# Patient Record
Sex: Female | Born: 1965
Health system: Southern US, Community
[De-identification: ages and names within clinical notes are randomized; demographics above are authoritative.]

## PROBLEM LIST (undated history)

## (undated) HISTORY — PX: TONSILLECTOMY: SUR1361

---

## 1999-10-01 ENCOUNTER — Other Ambulatory Visit: Admission: RE | Admit: 1999-10-01 | Discharge: 1999-10-01 | Payer: Self-pay | Admitting: Obstetrics & Gynecology

## 2001-04-06 ENCOUNTER — Other Ambulatory Visit: Admission: RE | Admit: 2001-04-06 | Discharge: 2001-04-06 | Payer: Self-pay | Admitting: Obstetrics & Gynecology

## 2003-02-15 ENCOUNTER — Other Ambulatory Visit: Admission: RE | Admit: 2003-02-15 | Discharge: 2003-02-15 | Payer: Self-pay | Admitting: Obstetrics and Gynecology

## 2018-10-14 DIAGNOSIS — R7301 Impaired fasting glucose: Secondary | ICD-10-CM | POA: Diagnosis not present

## 2018-10-14 DIAGNOSIS — Z6828 Body mass index (BMI) 28.0-28.9, adult: Secondary | ICD-10-CM | POA: Diagnosis not present

## 2018-10-14 DIAGNOSIS — Z1331 Encounter for screening for depression: Secondary | ICD-10-CM | POA: Diagnosis not present

## 2018-10-14 DIAGNOSIS — Z0000 Encounter for general adult medical examination without abnormal findings: Secondary | ICD-10-CM | POA: Diagnosis not present

## 2018-10-14 DIAGNOSIS — E559 Vitamin D deficiency, unspecified: Secondary | ICD-10-CM | POA: Diagnosis not present

## 2018-10-22 DIAGNOSIS — J302 Other seasonal allergic rhinitis: Secondary | ICD-10-CM | POA: Diagnosis not present

## 2018-11-04 MED FILL — DULOXETINE HCL 60 MG CPEP: 60 | 30 days supply | Qty: 60 | Fill #0

## 2018-12-09 MED FILL — DULOXETINE HCL 60 MG CPEP: 60 | 30 days supply | Qty: 60 | Fill #1

## 2019-02-01 ENCOUNTER — Other Ambulatory Visit: Payer: Self-pay | Admitting: Family

## 2019-02-01 ENCOUNTER — Other Ambulatory Visit: Payer: Self-pay | Admitting: Nurse Practitioner

## 2019-02-01 DIAGNOSIS — Z1231 Encounter for screening mammogram for malignant neoplasm of breast: Secondary | ICD-10-CM

## 2019-03-10 ENCOUNTER — Ambulatory Visit
Admission: RE | Admit: 2019-03-10 | Discharge: 2019-03-10 | Disposition: A | Discharge status: Home / Self Care | Payer: 59 | Source: Ambulatory Visit | Attending: Nurse Practitioner | Admitting: Nurse Practitioner

## 2019-03-10 ENCOUNTER — Encounter: Payer: Self-pay | Admitting: Radiology

## 2019-03-10 DIAGNOSIS — Z1231 Encounter for screening mammogram for malignant neoplasm of breast: Secondary | ICD-10-CM | POA: Diagnosis not present

## 2019-04-20 DIAGNOSIS — E559 Vitamin D deficiency, unspecified: Secondary | ICD-10-CM | POA: Diagnosis not present

## 2019-04-20 DIAGNOSIS — E785 Hyperlipidemia, unspecified: Secondary | ICD-10-CM | POA: Diagnosis not present

## 2019-04-20 DIAGNOSIS — R7303 Prediabetes: Secondary | ICD-10-CM | POA: Diagnosis not present

## 2019-10-16 ENCOUNTER — Other Ambulatory Visit: Payer: Self-pay | Admitting: Family

## 2019-10-16 DIAGNOSIS — Z1331 Encounter for screening for depression: Secondary | ICD-10-CM | POA: Diagnosis not present

## 2019-10-16 DIAGNOSIS — Z0000 Encounter for general adult medical examination without abnormal findings: Secondary | ICD-10-CM | POA: Diagnosis not present

## 2019-11-01 DIAGNOSIS — Z0000 Encounter for general adult medical examination without abnormal findings: Secondary | ICD-10-CM | POA: Diagnosis not present

## 2019-11-01 DIAGNOSIS — E785 Hyperlipidemia, unspecified: Secondary | ICD-10-CM | POA: Diagnosis not present

## 2019-11-01 DIAGNOSIS — R7303 Prediabetes: Secondary | ICD-10-CM | POA: Diagnosis not present

## 2019-11-01 DIAGNOSIS — E559 Vitamin D deficiency, unspecified: Secondary | ICD-10-CM | POA: Diagnosis not present

## 2020-04-08 DIAGNOSIS — J029 Acute pharyngitis, unspecified: Secondary | ICD-10-CM | POA: Diagnosis not present

## 2020-04-08 DIAGNOSIS — Z20828 Contact with and (suspected) exposure to other viral communicable diseases: Secondary | ICD-10-CM | POA: Diagnosis not present

## 2020-05-08 ENCOUNTER — Other Ambulatory Visit: Payer: Self-pay | Admitting: Family

## 2020-05-08 DIAGNOSIS — Z1231 Encounter for screening mammogram for malignant neoplasm of breast: Secondary | ICD-10-CM

## 2020-06-05 ENCOUNTER — Other Ambulatory Visit: Payer: Self-pay

## 2020-06-05 ENCOUNTER — Ambulatory Visit
Admission: RE | Admit: 2020-06-05 | Discharge: 2020-06-05 | Disposition: A | Discharge status: Home / Self Care | Payer: 59 | Source: Ambulatory Visit | Attending: Family | Admitting: Family

## 2020-06-05 DIAGNOSIS — Z1231 Encounter for screening mammogram for malignant neoplasm of breast: Secondary | ICD-10-CM | POA: Diagnosis not present

## 2020-09-06 DIAGNOSIS — U071 COVID-19: Secondary | ICD-10-CM | POA: Diagnosis not present

## 2020-10-15 ENCOUNTER — Other Ambulatory Visit: Payer: Self-pay | Admitting: Family

## 2020-10-16 DIAGNOSIS — N951 Menopausal and female climacteric states: Secondary | ICD-10-CM | POA: Diagnosis not present

## 2020-10-16 DIAGNOSIS — Z0000 Encounter for general adult medical examination without abnormal findings: Secondary | ICD-10-CM | POA: Diagnosis not present

## 2020-10-16 DIAGNOSIS — Z6828 Body mass index (BMI) 28.0-28.9, adult: Secondary | ICD-10-CM | POA: Diagnosis not present

## 2020-10-16 DIAGNOSIS — F419 Anxiety disorder, unspecified: Secondary | ICD-10-CM | POA: Diagnosis not present

## 2020-11-13 ENCOUNTER — Other Ambulatory Visit: Payer: Self-pay

## 2020-11-13 MED FILL — Duloxetine HCl Enteric Coated Pellets Cap 60 MG (Base Eq): ORAL | 30 days supply | Qty: 60 | Fill #0 | Status: AC

## 2020-12-10 ENCOUNTER — Other Ambulatory Visit: Payer: Self-pay

## 2020-12-10 MED FILL — Duloxetine HCl Enteric Coated Pellets Cap 60 MG (Base Eq): ORAL | 30 days supply | Qty: 60 | Fill #1 | Status: AC

## 2021-01-13 ENCOUNTER — Other Ambulatory Visit: Payer: Self-pay

## 2021-01-13 MED FILL — Duloxetine HCl Enteric Coated Pellets Cap 60 MG (Base Eq): ORAL | 30 days supply | Qty: 60 | Fill #2 | Status: AC

## 2021-01-30 ENCOUNTER — Other Ambulatory Visit
Admission: RE | Admit: 2021-01-30 | Discharge: 2021-01-30 | Disposition: A | Discharge status: Home / Self Care | Payer: 59 | Source: Ambulatory Visit | Attending: Family | Admitting: Family

## 2021-01-30 DIAGNOSIS — E785 Hyperlipidemia, unspecified: Secondary | ICD-10-CM | POA: Diagnosis not present

## 2021-01-30 DIAGNOSIS — Z0000 Encounter for general adult medical examination without abnormal findings: Secondary | ICD-10-CM | POA: Insufficient documentation

## 2021-01-30 DIAGNOSIS — E559 Vitamin D deficiency, unspecified: Secondary | ICD-10-CM | POA: Insufficient documentation

## 2021-01-30 DIAGNOSIS — R7303 Prediabetes: Secondary | ICD-10-CM | POA: Diagnosis not present

## 2021-01-30 LAB — COMPREHENSIVE METABOLIC PANEL
ALT: 14 U/L (ref 0–44)
AST: 16 U/L (ref 15–41)
Albumin: 4.1 g/dL (ref 3.5–5.0)
Alkaline Phosphatase: 88 U/L (ref 38–126)
Anion gap: 9 (ref 5–15)
BUN: 13 mg/dL (ref 6–20)
CO2: 29 mmol/L (ref 22–32)
Calcium: 9.4 mg/dL (ref 8.9–10.3)
Chloride: 102 mmol/L (ref 98–111)
Creatinine, Ser: 0.77 mg/dL (ref 0.44–1.00)
GFR, Estimated: >60 mL/min (ref >60)
Glucose, Bld: 109 mg/dL — ABNORMAL HIGH (ref 70–99)
Potassium: 4.9 mmol/L (ref 3.5–5.1)
Sodium: 140 mmol/L (ref 135–145)
Total Bilirubin: 0.5 mg/dL (ref 0.3–1.2)
Total Protein: 7.2 g/dL (ref 6.5–8.1)

## 2021-01-30 LAB — LIPID PANEL
Cholesterol: 202 mg/dL — ABNORMAL HIGH (ref 0–200)
HDL: 60 mg/dL (ref >40)
LDL Cholesterol: 124 mg/dL — ABNORMAL HIGH (ref 0–99)
Total CHOL/HDL Ratio: 3.4 RATIO
Triglycerides: 90 mg/dL (ref <150)
VLDL: 18 mg/dL (ref 0–40)

## 2021-01-30 LAB — HEMOGLOBIN A1C
Hgb A1c MFr Bld: 5.8 % — ABNORMAL HIGH (ref 4.8–5.6)
Mean Plasma Glucose: 120 mg/dL

## 2021-01-30 LAB — VITAMIN D 25 HYDROXY (VIT D DEFICIENCY, FRACTURES): Vit D, 25-Hydroxy: 28.65 ng/mL — ABNORMAL LOW (ref 30–100)

## 2021-02-13 ENCOUNTER — Other Ambulatory Visit: Payer: Self-pay

## 2021-02-13 MED FILL — Duloxetine HCl Enteric Coated Pellets Cap 60 MG (Base Eq): ORAL | 30 days supply | Qty: 60 | Fill #3 | Status: AC

## 2021-03-14 ENCOUNTER — Other Ambulatory Visit: Payer: Self-pay

## 2021-03-14 MED FILL — Duloxetine HCl Enteric Coated Pellets Cap 60 MG (Base Eq): ORAL | 30 days supply | Qty: 60 | Fill #4 | Status: AC

## 2021-04-01 DIAGNOSIS — Z6829 Body mass index (BMI) 29.0-29.9, adult: Secondary | ICD-10-CM | POA: Diagnosis not present

## 2021-04-01 DIAGNOSIS — R7303 Prediabetes: Secondary | ICD-10-CM | POA: Diagnosis not present

## 2021-04-01 DIAGNOSIS — F411 Generalized anxiety disorder: Secondary | ICD-10-CM | POA: Diagnosis not present

## 2021-04-01 DIAGNOSIS — E782 Mixed hyperlipidemia: Secondary | ICD-10-CM | POA: Diagnosis not present

## 2021-04-04 ENCOUNTER — Other Ambulatory Visit: Payer: Self-pay

## 2021-04-04 MED FILL — Duloxetine HCl Enteric Coated Pellets Cap 60 MG (Base Eq): ORAL | 30 days supply | Qty: 60 | Fill #5 | Status: AC

## 2021-04-08 ENCOUNTER — Other Ambulatory Visit: Payer: Self-pay

## 2021-05-15 ENCOUNTER — Other Ambulatory Visit: Payer: Self-pay

## 2021-05-15 MED FILL — Duloxetine HCl Enteric Coated Pellets Cap 60 MG (Base Eq): ORAL | 30 days supply | Qty: 60 | Fill #6 | Status: AC

## 2021-06-13 ENCOUNTER — Other Ambulatory Visit: Payer: Self-pay

## 2021-06-13 MED FILL — Duloxetine HCl Enteric Coated Pellets Cap 60 MG (Base Eq): ORAL | 30 days supply | Qty: 60 | Fill #7 | Status: AC

## 2021-07-02 DIAGNOSIS — J302 Other seasonal allergic rhinitis: Secondary | ICD-10-CM | POA: Diagnosis not present

## 2021-07-08 ENCOUNTER — Other Ambulatory Visit: Payer: Self-pay

## 2021-07-08 MED FILL — Duloxetine HCl Enteric Coated Pellets Cap 60 MG (Base Eq): ORAL | 30 days supply | Qty: 60 | Fill #8 | Status: AC

## 2021-07-11 ENCOUNTER — Other Ambulatory Visit: Payer: Self-pay

## 2021-07-11 ENCOUNTER — Ambulatory Visit
Admission: EM | Admit: 2021-07-11 | Discharge: 2021-07-11 | Disposition: A | Discharge status: Home / Self Care | Payer: 59 | Attending: Family Medicine | Admitting: Family Medicine

## 2021-07-11 DIAGNOSIS — R509 Fever, unspecified: Secondary | ICD-10-CM | POA: Diagnosis not present

## 2021-07-11 DIAGNOSIS — N3000 Acute cystitis without hematuria: Secondary | ICD-10-CM | POA: Diagnosis not present

## 2021-07-11 DIAGNOSIS — E860 Dehydration: Secondary | ICD-10-CM | POA: Diagnosis not present

## 2021-07-11 LAB — POCT URINALYSIS DIP (MANUAL ENTRY)
Bilirubin, UA: NEGATIVE
Blood, UA: NEGATIVE
Glucose, UA: NEGATIVE mg/dL
Ketones, POC UA: NEGATIVE mg/dL
Leukocytes, UA: NEGATIVE
Nitrite, UA: NEGATIVE
Protein Ur, POC: NEGATIVE mg/dL
Spec Grav, UA: 1.025 (ref 1.010–1.025)
Urobilinogen, UA: 0.2 E.U./dL
pH, UA: 6 (ref 5.0–8.0)

## 2021-07-11 MED ORDER — NITROFURANTOIN MONOHYD MACRO 100 MG PO CAPS
100.0000 mg | ORAL_CAPSULE | Freq: Two times a day (BID) | ORAL | 0 refills | Status: AC
  Filled 2021-07-11: qty 6, 3d supply, fill #0

## 2021-07-11 NOTE — ED Provider Notes (Signed)
Roderic Palau    CSN: [id number] Arrival date & time: 07/11/21  1027      History   Chief Complaint Chief Complaint  Patient presents with   Dysuria    HPI Brooke Figueroa is a 55 y.o. female.   HPI Patient presents today with a 4-day history of low-grade fevers, pressure when urinating , urinary frequency, and dysuria.  Patient has a low-grade temp on arrival of 99.1.  Patient denies a history of recurrent urinary tract infections.  She denies any abdominal pain or CVA tenderness.  She denies any nausea, vomiting, or any visible bleeding when urinating.  History reviewed. No pertinent past medical history.  There are no problems to display for this patient.   Past Surgical History:  Procedure Laterality Date   TONSILLECTOMY      OB History   No obstetric history on file.      Home Medications    Prior to Admission medications   Medication Sig Start Date End Date Taking? Authorizing Provider  nitrofurantoin, macrocrystal-monohydrate, (MACROBID) 100 MG capsule Take 1 capsule (100 mg total) by mouth 2 (two) times daily for 3 days. 07/11/21 07/14/21 Yes Scot Jun, FNP  DULoxetine (CYMBALTA) 60 MG capsule TAKE 2 CAPSULES BY MOUTH DAILY 10/15/20 10/15/21  Madison Hickman, FNP  DULoxetine (CYMBALTA) 60 MG capsule TAKE 2 CAPSULES (120 MG) BY MOUTH DAILY 10/16/19 10/15/20  Madison Hickman, FNP    Family History Family History  Problem Relation Age of Onset   Breast cancer Paternal Aunt    Breast cancer Cousin     Social History Social History   Tobacco Use   Smoking status: Never   Smokeless tobacco: Never  Substance Use Topics   Alcohol use: Not Currently     Allergies   Patient has no known allergies.   Review of Systems Review of Systems Pertinent negatives listed in HPI  Physical Exam Triage Vital Signs ED Triage Vitals  Enc Vitals Group     BP 07/11/21 1151 (!) 146/78     Pulse Rate 07/11/21 1151 (!) 126     Resp 07/11/21 1151 18      Temp 07/11/21 1151 99.1 F (37.3 C)     Temp src --      SpO2 07/11/21 1151 95 %     Weight --      Height --      Head Circumference --      Peak Flow --      Pain Score 07/11/21 1148 3     Pain Loc --      Pain Edu? --      Excl. in [place]? --    No data found.  Updated Vital Signs BP (!) 146/78   Pulse (!) 126   Temp 99.1 F (37.3 C)   Resp 18   SpO2 95%   Visual Acuity Right Eye Distance:   Left Eye Distance:   Bilateral Distance:    Right Eye Near:   Left Eye Near:    Bilateral Near:     Physical Exam General appearance: Alert, well developed, non-ill-appearing, well nourished, cooperative  Head: Normocephalic, without obvious abnormality, atraumatic Respiratory: Respirations even and unlabored, normal respiratory rate Heart: Tachycardia with normal rhythm . No gallop or murmurs noted on exam  Abdomen: BS +, no CVA tenderness, no abdominal distention, no tenderness Extremities: No gross deformities Skin: Skin color, texture, turgor normal. No rashes seen  Psych: Appropriate mood and affect. Neurologic:  GCS 15, normal coordination, normal gait  UC Treatments / Results  Labs (all labs ordered are listed, but only abnormal results are displayed) Labs Reviewed  POCT URINALYSIS DIP (MANUAL ENTRY) - Abnormal; Notable for the following components:      Result Value   Clarity, UA cloudy (*)    All other components within normal limits  URINE CULTURE    EKG   Radiology No results found.  Procedures Procedures (including critical care time)  Medications Ordered in UC Medications - No data to display  Initial Impression / Assessment and Plan / UC Course  I have reviewed the triage vital signs and the nursing notes.  Pertinent labs & imaging results that were available during my care of the patient were reviewed by me and considered in my medical decision making (see chart for details).    UA is unremarkable for urinary tract infection however urine is  diffusely cloudy and patient admits to taking a over-the-counter medication to help with urinary symptoms therefore I will culture urine.  Given patient does have a low-grade temp and symptoms of urinary tract infection I will go ahead and presumptively treat her for 3 days with Macrobid while awaiting the urine culture.  Urine SPG was elevated and patient is tachycardic and has not been drinking many fluids advised to force fluids to prevent worsening of dehydration. Strict ER precautions given. Patient verbalized understanding and agreement with plan. Final Clinical Impressions(s) / UC Diagnoses   Final diagnoses:  Fever, unspecified fever cause  Acute cystitis without hematuria  Mild dehydration     Discharge Instructions      Your urine culture should result within the next 3 days.  I am starting you on nitrofurantoin which is an antibiotic sensitive to most bacteria that develops in the urine.  You are mildly dehydrated recommend forcing fluids today and monitoring her temperature if greater than 100 managed with Tylenol or ibuprofen.  If you experience any worsening of symptoms return for evaluation.     ED Prescriptions     Medication Sig Dispense Auth. Provider   nitrofurantoin, macrocrystal-monohydrate, (MACROBID) 100 MG capsule Take 1 capsule (100 mg total) by mouth 2 (two) times daily for 3 days. 6 capsule Bing Neighbors, FNP      PDMP not reviewed this encounter.   Bing Neighbors, FNP 07/11/21 [phone]

## 2021-07-11 NOTE — ED Triage Notes (Signed)
Pt presents with dysuria that began on Sunday, reports low grade fever this am

## 2021-07-11 NOTE — Discharge Instructions (Signed)
Your urine culture should result within the next 3 days.  I am starting you on nitrofurantoin which is an antibiotic sensitive to most bacteria that develops in the urine.  You are mildly dehydrated recommend forcing fluids today and monitoring her temperature if greater than 100 managed with Tylenol or ibuprofen.  If you experience any worsening of symptoms return for evaluation.

## 2021-07-12 LAB — URINE CULTURE
Culture: NO GROWTH
Special Requests: NORMAL

## 2021-07-30 DIAGNOSIS — R300 Dysuria: Secondary | ICD-10-CM | POA: Diagnosis not present

## 2021-07-30 DIAGNOSIS — R3129 Other microscopic hematuria: Secondary | ICD-10-CM | POA: Diagnosis not present

## 2021-07-30 DIAGNOSIS — N390 Urinary tract infection, site not specified: Secondary | ICD-10-CM | POA: Diagnosis not present

## 2021-08-13 ENCOUNTER — Other Ambulatory Visit: Payer: Self-pay

## 2021-08-13 MED FILL — Duloxetine HCl Enteric Coated Pellets Cap 60 MG (Base Eq): ORAL | 30 days supply | Qty: 60 | Fill #9 | Status: AC

## 2021-09-15 ENCOUNTER — Other Ambulatory Visit: Payer: Self-pay

## 2021-09-15 MED FILL — Duloxetine HCl Enteric Coated Pellets Cap 60 MG (Base Eq): ORAL | 30 days supply | Qty: 60 | Fill #10 | Status: AC

## 2021-09-16 ENCOUNTER — Other Ambulatory Visit: Payer: Self-pay | Admitting: Family

## 2021-09-16 DIAGNOSIS — Z1231 Encounter for screening mammogram for malignant neoplasm of breast: Secondary | ICD-10-CM

## 2021-10-20 ENCOUNTER — Other Ambulatory Visit: Payer: Self-pay

## 2021-10-20 MED ORDER — DULOXETINE HCL 60 MG PO CPEP
ORAL_CAPSULE | ORAL | 0 refills | Status: DC
  Filled 2021-10-20: qty 180, 90d supply, fill #0

## 2021-10-22 ENCOUNTER — Other Ambulatory Visit: Payer: Self-pay

## 2021-10-22 ENCOUNTER — Ambulatory Visit
Admission: RE | Admit: 2021-10-22 | Discharge: 2021-10-22 | Disposition: A | Discharge status: Home / Self Care | Payer: 59 | Source: Ambulatory Visit | Attending: Family | Admitting: Family

## 2021-10-22 DIAGNOSIS — Z1231 Encounter for screening mammogram for malignant neoplasm of breast: Secondary | ICD-10-CM | POA: Diagnosis not present

## 2021-11-03 DIAGNOSIS — E559 Vitamin D deficiency, unspecified: Secondary | ICD-10-CM | POA: Diagnosis not present

## 2021-11-03 DIAGNOSIS — F411 Generalized anxiety disorder: Secondary | ICD-10-CM | POA: Diagnosis not present

## 2021-11-03 DIAGNOSIS — Z0000 Encounter for general adult medical examination without abnormal findings: Secondary | ICD-10-CM | POA: Diagnosis not present

## 2021-11-03 DIAGNOSIS — E782 Mixed hyperlipidemia: Secondary | ICD-10-CM | POA: Diagnosis not present

## 2021-11-03 DIAGNOSIS — Z1231 Encounter for screening mammogram for malignant neoplasm of breast: Secondary | ICD-10-CM | POA: Diagnosis not present

## 2021-11-03 DIAGNOSIS — R7303 Prediabetes: Secondary | ICD-10-CM | POA: Diagnosis not present

## 2021-11-03 DIAGNOSIS — Z1331 Encounter for screening for depression: Secondary | ICD-10-CM | POA: Diagnosis not present

## 2022-01-26 ENCOUNTER — Other Ambulatory Visit: Payer: Self-pay

## 2022-01-26 MED ORDER — DULOXETINE HCL 60 MG PO CPEP
ORAL_CAPSULE | ORAL | 1 refills | Status: DC
  Filled 2022-01-26: qty 180, 90d supply, fill #0
  Filled 2022-04-10: qty 180, 90d supply, fill #1

## 2022-04-10 ENCOUNTER — Other Ambulatory Visit: Payer: Self-pay

## 2022-05-05 ENCOUNTER — Other Ambulatory Visit: Payer: Self-pay

## 2022-05-05 DIAGNOSIS — Z1211 Encounter for screening for malignant neoplasm of colon: Secondary | ICD-10-CM | POA: Diagnosis not present

## 2022-05-05 DIAGNOSIS — E559 Vitamin D deficiency, unspecified: Secondary | ICD-10-CM | POA: Diagnosis not present

## 2022-05-05 DIAGNOSIS — Z6827 Body mass index (BMI) 27.0-27.9, adult: Secondary | ICD-10-CM | POA: Diagnosis not present

## 2022-05-05 DIAGNOSIS — F411 Generalized anxiety disorder: Secondary | ICD-10-CM | POA: Diagnosis not present

## 2022-05-05 DIAGNOSIS — R7303 Prediabetes: Secondary | ICD-10-CM | POA: Diagnosis not present

## 2022-05-05 DIAGNOSIS — E782 Mixed hyperlipidemia: Secondary | ICD-10-CM | POA: Diagnosis not present

## 2022-05-05 MED ORDER — DULOXETINE HCL 60 MG PO CPEP
120.0000 mg | ORAL_CAPSULE | Freq: Every day | ORAL | 1 refills | Status: DC
  Filled 2022-05-05 – 2022-07-20 (×2): qty 180, 90d supply, fill #0
  Filled 2022-10-07: qty 180, 90d supply, fill #1

## 2022-06-02 DIAGNOSIS — J302 Other seasonal allergic rhinitis: Secondary | ICD-10-CM | POA: Diagnosis not present

## 2022-07-20 ENCOUNTER — Other Ambulatory Visit: Payer: Self-pay

## 2022-10-07 ENCOUNTER — Other Ambulatory Visit (HOSPITAL_COMMUNITY): Payer: Self-pay

## 2022-11-24 ENCOUNTER — Other Ambulatory Visit (HOSPITAL_COMMUNITY): Payer: Self-pay

## 2022-11-24 DIAGNOSIS — R7303 Prediabetes: Secondary | ICD-10-CM | POA: Diagnosis not present

## 2022-11-24 DIAGNOSIS — E559 Vitamin D deficiency, unspecified: Secondary | ICD-10-CM | POA: Diagnosis not present

## 2022-11-24 DIAGNOSIS — E782 Mixed hyperlipidemia: Secondary | ICD-10-CM | POA: Diagnosis not present

## 2022-11-24 DIAGNOSIS — Z1331 Encounter for screening for depression: Secondary | ICD-10-CM | POA: Diagnosis not present

## 2022-11-24 DIAGNOSIS — F411 Generalized anxiety disorder: Secondary | ICD-10-CM | POA: Diagnosis not present

## 2022-11-24 DIAGNOSIS — Z0000 Encounter for general adult medical examination without abnormal findings: Secondary | ICD-10-CM | POA: Diagnosis not present

## 2022-11-24 DIAGNOSIS — Z6828 Body mass index (BMI) 28.0-28.9, adult: Secondary | ICD-10-CM | POA: Diagnosis not present

## 2022-11-24 MED ORDER — DULOXETINE HCL 60 MG PO CPEP
120.0000 mg | ORAL_CAPSULE | Freq: Every day | ORAL | 1 refills | Status: DC
  Filled 2022-11-24 – 2023-02-01 (×3): qty 180, 90d supply, fill #0
  Filled 2023-04-21: qty 180, 90d supply, fill #1

## 2022-12-10 ENCOUNTER — Other Ambulatory Visit (HOSPITAL_COMMUNITY): Payer: Self-pay

## 2023-01-25 ENCOUNTER — Other Ambulatory Visit (HOSPITAL_COMMUNITY)
Admission: RE | Admit: 2023-01-25 | Discharge: 2023-01-25 | Disposition: A | Discharge status: Home / Self Care | Payer: 59 | Source: Ambulatory Visit | Attending: Family | Admitting: Family

## 2023-01-25 DIAGNOSIS — E559 Vitamin D deficiency, unspecified: Secondary | ICD-10-CM | POA: Diagnosis not present

## 2023-01-25 DIAGNOSIS — R7303 Prediabetes: Secondary | ICD-10-CM | POA: Diagnosis not present

## 2023-01-25 DIAGNOSIS — E782 Mixed hyperlipidemia: Secondary | ICD-10-CM | POA: Diagnosis not present

## 2023-01-25 LAB — CBC WITH DIFFERENTIAL/PLATELET
Abs Immature Granulocytes: 0.03 10*3/uL (ref 0.00–0.07)
Basophils Absolute: 0.1 10*3/uL (ref 0.0–0.1)
Basophils Relative: 1 %
Eosinophils Absolute: 0.3 10*3/uL (ref 0.0–0.5)
Eosinophils Relative: 3 %
HCT: 39.9 % (ref 36.0–46.0)
Hemoglobin: 12.7 g/dL (ref 12.0–15.0)
Immature Granulocytes: 0 %
Lymphocytes Relative: 22 %
Lymphs Abs: 1.8 10*3/uL (ref 0.7–4.0)
MCH: 27 pg (ref 26.0–34.0)
MCHC: 31.8 g/dL (ref 30.0–36.0)
MCV: 84.9 fL (ref 80.0–100.0)
Monocytes Absolute: 0.5 10*3/uL (ref 0.1–1.0)
Monocytes Relative: 6 %
Neutro Abs: 5.6 10*3/uL (ref 1.7–7.7)
Neutrophils Relative %: 68 %
Platelets: 303 10*3/uL (ref 150–400)
RBC: 4.7 MIL/uL (ref 3.87–5.11)
RDW: 13.8 % (ref 11.5–15.5)
WBC: 8.2 10*3/uL (ref 4.0–10.5)
nRBC: 0 % (ref 0.0–0.2)

## 2023-01-25 LAB — COMPREHENSIVE METABOLIC PANEL
ALT: 16 U/L (ref 0–44)
AST: 18 U/L (ref 15–41)
Albumin: 3.7 g/dL (ref 3.5–5.0)
Alkaline Phosphatase: 96 U/L (ref 38–126)
Anion gap: 11 (ref 5–15)
BUN: 8 mg/dL (ref 6–20)
CO2: 26 mmol/L (ref 22–32)
Calcium: 9.4 mg/dL (ref 8.9–10.3)
Chloride: 106 mmol/L (ref 98–111)
Creatinine, Ser: 0.76 mg/dL (ref 0.44–1.00)
GFR, Estimated: >60 mL/min (ref >60)
Glucose, Bld: 111 mg/dL — ABNORMAL HIGH (ref 70–99)
Potassium: 4.1 mmol/L (ref 3.5–5.1)
Sodium: 143 mmol/L (ref 135–145)
Total Bilirubin: 0.7 mg/dL (ref 0.3–1.2)
Total Protein: 7 g/dL (ref 6.5–8.1)

## 2023-01-25 LAB — LIPID PANEL
Cholesterol: 208 mg/dL — ABNORMAL HIGH (ref 0–200)
HDL: 57 mg/dL (ref >40)
LDL Cholesterol: 136 mg/dL — ABNORMAL HIGH (ref 0–99)
Total CHOL/HDL Ratio: 3.6 RATIO
Triglycerides: 73 mg/dL (ref <150)
VLDL: 15 mg/dL (ref 0–40)

## 2023-01-25 LAB — HEMOGLOBIN A1C
Hgb A1c MFr Bld: 5.6 % (ref 4.8–5.6)
Mean Plasma Glucose: 114.02 mg/dL

## 2023-01-29 ENCOUNTER — Other Ambulatory Visit (HOSPITAL_COMMUNITY): Payer: Self-pay

## 2023-01-29 ENCOUNTER — Other Ambulatory Visit: Payer: Self-pay

## 2023-02-01 ENCOUNTER — Other Ambulatory Visit: Payer: Self-pay

## 2023-02-01 ENCOUNTER — Other Ambulatory Visit (HOSPITAL_COMMUNITY): Payer: Self-pay

## 2023-02-01 LAB — VITAMIN D 1,25 DIHYDROXY
Vitamin D 1, 25 (OH)2 Total: 140 pg/mL — ABNORMAL HIGH
Vitamin D2 1, 25 (OH)2: 92 pg/mL
Vitamin D3 1, 25 (OH)2: 48 pg/mL

## 2023-05-11 ENCOUNTER — Other Ambulatory Visit (HOSPITAL_COMMUNITY): Payer: Self-pay

## 2023-05-11 DIAGNOSIS — E782 Mixed hyperlipidemia: Secondary | ICD-10-CM | POA: Diagnosis not present

## 2023-05-11 DIAGNOSIS — Z6827 Body mass index (BMI) 27.0-27.9, adult: Secondary | ICD-10-CM | POA: Diagnosis not present

## 2023-05-11 DIAGNOSIS — F411 Generalized anxiety disorder: Secondary | ICD-10-CM | POA: Diagnosis not present

## 2023-05-11 DIAGNOSIS — R7303 Prediabetes: Secondary | ICD-10-CM | POA: Diagnosis not present

## 2023-05-11 DIAGNOSIS — E559 Vitamin D deficiency, unspecified: Secondary | ICD-10-CM | POA: Diagnosis not present

## 2023-05-11 MED ORDER — DULOXETINE HCL 60 MG PO CPEP
120.0000 mg | ORAL_CAPSULE | Freq: Every day | ORAL | 1 refills | Status: AC
  Filled 2023-07-26: qty 180, 90d supply, fill #0
  Filled 2023-11-01 (×2): qty 180, 90d supply, fill #1

## 2023-07-26 ENCOUNTER — Other Ambulatory Visit (HOSPITAL_COMMUNITY): Payer: Self-pay

## 2023-10-09 DIAGNOSIS — J0111 Acute recurrent frontal sinusitis: Secondary | ICD-10-CM | POA: Diagnosis not present

## 2023-11-01 ENCOUNTER — Other Ambulatory Visit (HOSPITAL_COMMUNITY): Payer: Self-pay

## 2023-11-02 ENCOUNTER — Other Ambulatory Visit (HOSPITAL_COMMUNITY): Payer: Self-pay

## 2023-12-08 IMAGING — MG MM DIGITAL SCREENING BILAT W/ TOMO AND CAD
8 series · 8 of 24 positions shown · non-contrast
Comparison: Previous exam(s).

CLINICAL DATA: Screening.

EXAM:
DIGITAL SCREENING BILATERAL MAMMOGRAM WITH TOMOSYNTHESIS AND CAD
TECHNIQUE: Bilateral screening digital craniocaudal and mediolateral oblique
mammograms were obtained. Bilateral screening digital breast
tomosynthesis was performed. The images were evaluated with
computer-aided detection.

[L MLO synth-2D]
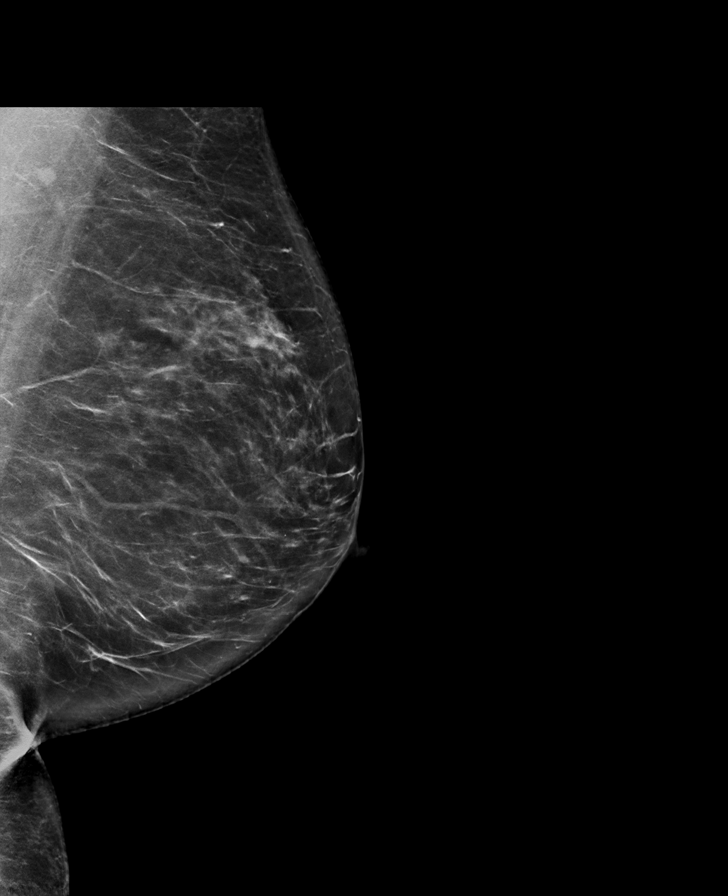

[R CC synth-2D]
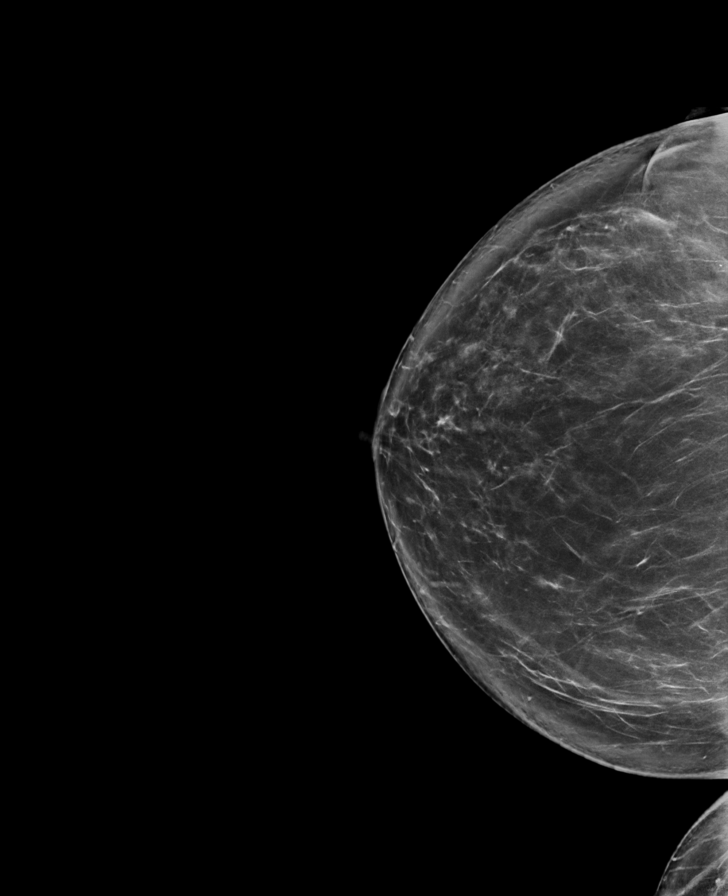

[L CC synth-2D]
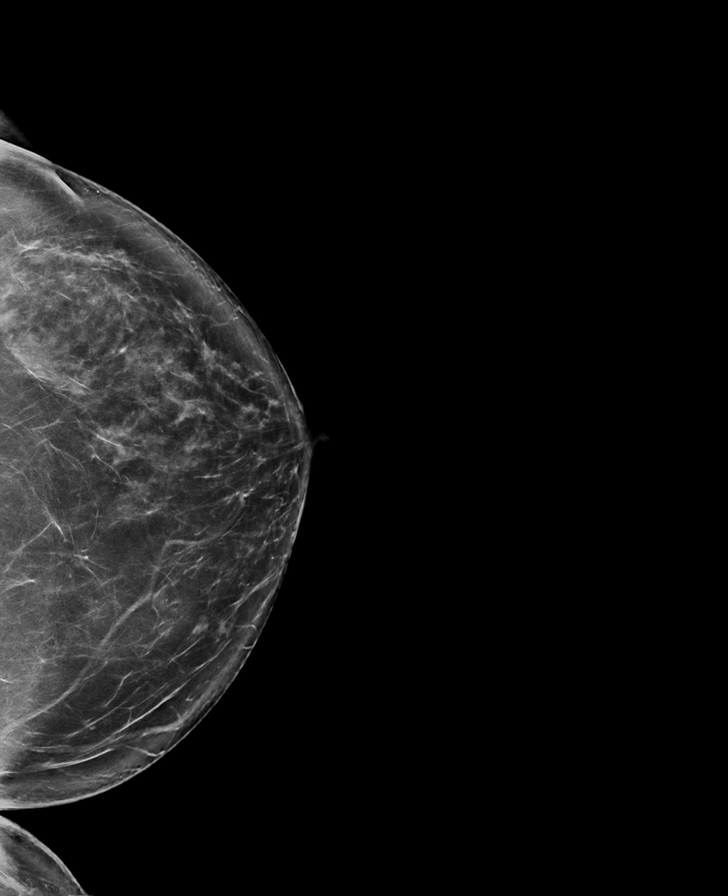

[R MLO synth-2D]
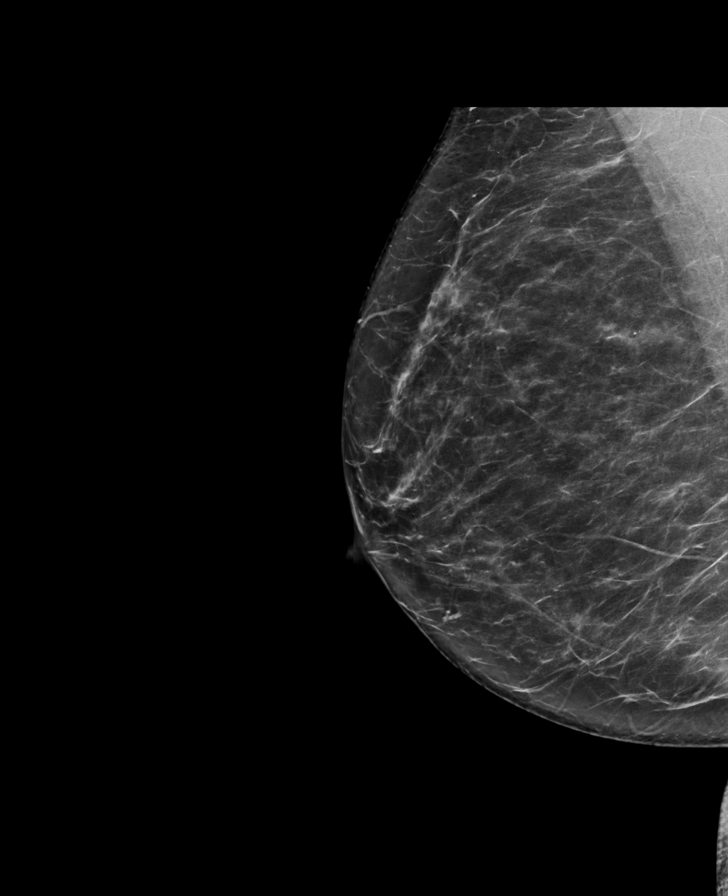

[R MLO tomo · tomo slice 42/83.0]
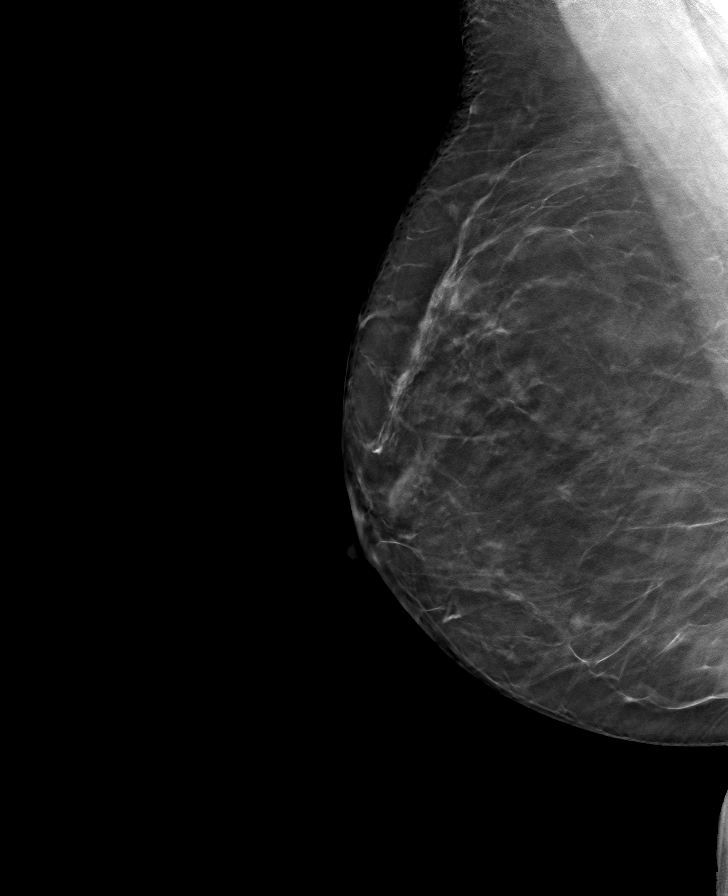

[L MLO tomo · tomo slice 42/83.0]
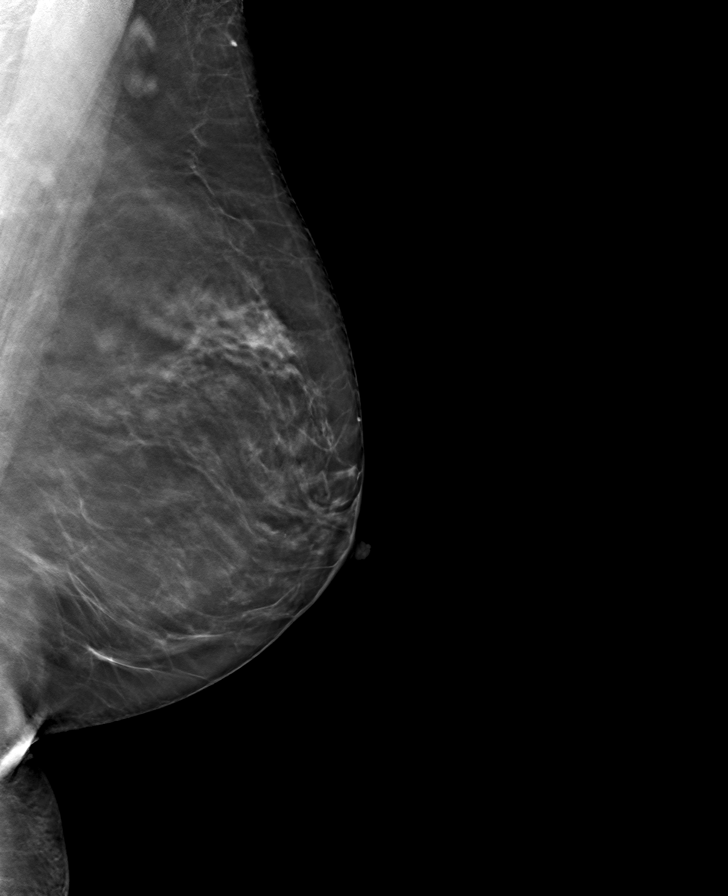

[R CC tomo · tomo slice 47/93.0]
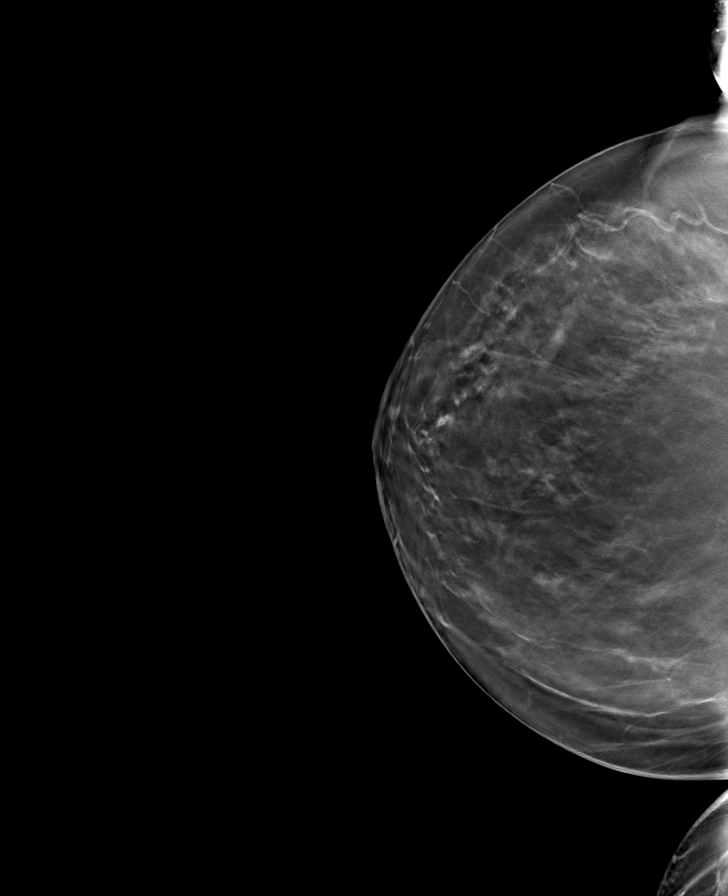

[L CC tomo · tomo slice 43/86.0]
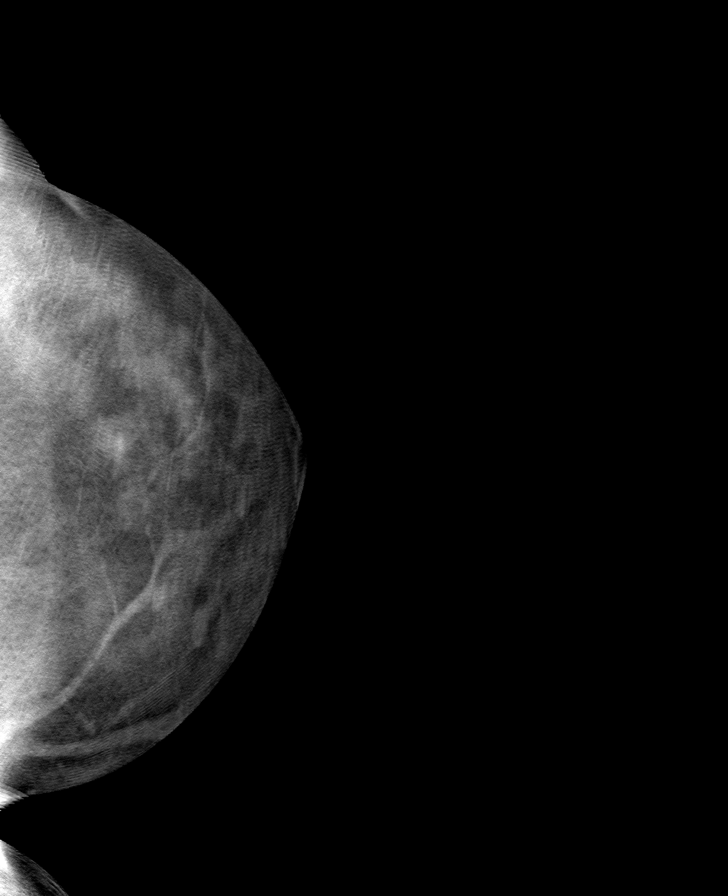

[8 of 24 positions shown; findings below may reference images not displayed]

ACR Breast Density Category b: There are scattered areas of
fibroglandular density.
FINDINGS: There are no findings suspicious for malignancy.
IMPRESSION: No mammographic evidence of malignancy. A result letter of this
screening mammogram will be mailed directly to the patient.

RECOMMENDATION:
Screening mammogram in one year. (Code:51-O-LD2)

BI-RADS CATEGORY  1: Negative.

## 2024-02-02 ENCOUNTER — Other Ambulatory Visit (HOSPITAL_COMMUNITY): Payer: Self-pay

## 2024-02-02 DIAGNOSIS — F411 Generalized anxiety disorder: Secondary | ICD-10-CM | POA: Diagnosis not present

## 2024-02-02 DIAGNOSIS — J302 Other seasonal allergic rhinitis: Secondary | ICD-10-CM | POA: Diagnosis not present

## 2024-02-02 DIAGNOSIS — E782 Mixed hyperlipidemia: Secondary | ICD-10-CM | POA: Diagnosis not present

## 2024-02-02 DIAGNOSIS — R7303 Prediabetes: Secondary | ICD-10-CM | POA: Diagnosis not present

## 2024-02-02 DIAGNOSIS — K219 Gastro-esophageal reflux disease without esophagitis: Secondary | ICD-10-CM | POA: Diagnosis not present

## 2024-02-02 MED ORDER — DULOXETINE HCL 60 MG PO CPEP
120.0000 mg | ORAL_CAPSULE | Freq: Every day | ORAL | 1 refills | Status: AC
  Filled 2024-02-02: qty 180, 90d supply, fill #0
  Filled 2024-05-12: qty 180, 90d supply, fill #1

## 2024-03-08 ENCOUNTER — Other Ambulatory Visit (HOSPITAL_COMMUNITY): Payer: Self-pay

## 2024-03-08 DIAGNOSIS — E782 Mixed hyperlipidemia: Secondary | ICD-10-CM | POA: Diagnosis not present

## 2024-03-08 DIAGNOSIS — F411 Generalized anxiety disorder: Secondary | ICD-10-CM | POA: Diagnosis not present

## 2024-03-08 DIAGNOSIS — J302 Other seasonal allergic rhinitis: Secondary | ICD-10-CM | POA: Diagnosis not present

## 2024-03-08 DIAGNOSIS — R7303 Prediabetes: Secondary | ICD-10-CM | POA: Diagnosis not present

## 2024-03-08 DIAGNOSIS — R030 Elevated blood-pressure reading, without diagnosis of hypertension: Secondary | ICD-10-CM | POA: Diagnosis not present

## 2024-03-08 DIAGNOSIS — Z0000 Encounter for general adult medical examination without abnormal findings: Secondary | ICD-10-CM | POA: Diagnosis not present

## 2024-03-08 DIAGNOSIS — K219 Gastro-esophageal reflux disease without esophagitis: Secondary | ICD-10-CM | POA: Diagnosis not present

## 2024-03-08 DIAGNOSIS — Z1211 Encounter for screening for malignant neoplasm of colon: Secondary | ICD-10-CM | POA: Diagnosis not present

## 2024-03-08 MED ORDER — DULOXETINE HCL 60 MG PO CPEP
120.0000 mg | ORAL_CAPSULE | Freq: Every day | ORAL | 1 refills | Status: AC
  Filled 2024-03-08: qty 180, 90d supply, fill #0

## 2024-03-08 MED ORDER — ESOMEPRAZOLE MAGNESIUM 40 MG PO CPDR
40.0000 mg | DELAYED_RELEASE_CAPSULE | Freq: Every day | ORAL | 3 refills | Status: AC
  Filled 2024-03-08: qty 90, 90d supply, fill #0

## 2024-05-12 ENCOUNTER — Other Ambulatory Visit (HOSPITAL_COMMUNITY): Payer: Self-pay

## 2024-05-15 ENCOUNTER — Other Ambulatory Visit (HOSPITAL_COMMUNITY): Payer: Self-pay

## 2024-05-16 ENCOUNTER — Other Ambulatory Visit (HOSPITAL_COMMUNITY): Payer: Self-pay

## 2024-06-02 DIAGNOSIS — R051 Acute cough: Secondary | ICD-10-CM | POA: Diagnosis not present

## 2024-06-02 DIAGNOSIS — R0981 Nasal congestion: Secondary | ICD-10-CM | POA: Diagnosis not present

## 2024-06-02 DIAGNOSIS — J019 Acute sinusitis, unspecified: Secondary | ICD-10-CM | POA: Diagnosis not present

## 2024-06-12 ENCOUNTER — Encounter: Payer: Self-pay | Admitting: Internal Medicine

## 2024-08-07 ENCOUNTER — Other Ambulatory Visit (HOSPITAL_COMMUNITY): Payer: Self-pay

## 2024-08-07 MED ORDER — DULOXETINE HCL 60 MG PO CPEP
60.0000 mg | ORAL_CAPSULE | Freq: Every day | ORAL | 1 refills | Status: AC
  Filled 2024-08-16: qty 90, 90d supply, fill #0

## 2024-08-07 MED ORDER — ESOMEPRAZOLE MAGNESIUM 40 MG PO CPDR
40.0000 mg | DELAYED_RELEASE_CAPSULE | Freq: Every morning | ORAL | 1 refills | Status: AC
  Filled 2024-08-16: qty 90, 90d supply, fill #0

## 2024-08-08 DIAGNOSIS — Z1239 Encounter for other screening for malignant neoplasm of breast: Secondary | ICD-10-CM

## 2024-08-15 ENCOUNTER — Ambulatory Visit (INDEPENDENT_AMBULATORY_CARE_PROVIDER_SITE_OTHER)
Admission: RE | Admit: 2024-08-15 | Discharge: 2024-08-15 | Disposition: A | Discharge status: Home / Self Care | Source: Ambulatory Visit | Attending: Nurse Practitioner | Admitting: Nurse Practitioner

## 2024-08-15 DIAGNOSIS — Z1231 Encounter for screening mammogram for malignant neoplasm of breast: Secondary | ICD-10-CM | POA: Diagnosis not present

## 2024-08-15 DIAGNOSIS — Z1239 Encounter for other screening for malignant neoplasm of breast: Secondary | ICD-10-CM

## 2024-08-16 ENCOUNTER — Other Ambulatory Visit (HOSPITAL_COMMUNITY): Payer: Self-pay

## 2024-08-17 ENCOUNTER — Other Ambulatory Visit: Payer: Self-pay

## 2024-08-17 ENCOUNTER — Other Ambulatory Visit (HOSPITAL_COMMUNITY): Payer: Self-pay

## 2024-08-18 ENCOUNTER — Other Ambulatory Visit (HOSPITAL_COMMUNITY): Payer: Self-pay

## 2024-09-08 ENCOUNTER — Other Ambulatory Visit (HOSPITAL_COMMUNITY): Payer: Self-pay

## 2024-09-08 MED ORDER — DULOXETINE HCL 60 MG PO CPEP
120.0000 mg | ORAL_CAPSULE | Freq: Every day | ORAL | 1 refills | Status: AC
  Filled 2024-09-08: qty 180, 90d supply, fill #0
